# Patient Record
Sex: Female | Born: 1955 | Race: White | Hispanic: No | Marital: Married | Smoking: Former smoker
Health system: Southern US, Community
[De-identification: ages and names within clinical notes are randomized; demographics above are authoritative.]

## PROBLEM LIST (undated history)

## (undated) DIAGNOSIS — R251 Tremor, unspecified: Secondary | ICD-10-CM

## (undated) DIAGNOSIS — E559 Vitamin D deficiency, unspecified: Secondary | ICD-10-CM

## (undated) DIAGNOSIS — F419 Anxiety disorder, unspecified: Secondary | ICD-10-CM

## (undated) DIAGNOSIS — K2970 Gastritis, unspecified, without bleeding: Secondary | ICD-10-CM

## (undated) DIAGNOSIS — E042 Nontoxic multinodular goiter: Secondary | ICD-10-CM

## (undated) DIAGNOSIS — M1990 Unspecified osteoarthritis, unspecified site: Secondary | ICD-10-CM

## (undated) HISTORY — PX: MOHS SURGERY: SUR867

## (undated) HISTORY — PX: RETINAL DETACHMENT SURGERY: SHX105

## (undated) HISTORY — PX: TONSILLECTOMY: SUR1361

---

## 1998-02-06 ENCOUNTER — Other Ambulatory Visit: Admission: RE | Admit: 1998-02-06 | Discharge: 1998-02-06 | Payer: Self-pay | Admitting: Obstetrics and Gynecology

## 1999-08-25 ENCOUNTER — Other Ambulatory Visit: Admission: RE | Admit: 1999-08-25 | Discharge: 1999-08-25 | Payer: Self-pay | Admitting: Obstetrics and Gynecology

## 2001-05-11 ENCOUNTER — Other Ambulatory Visit: Admission: RE | Admit: 2001-05-11 | Discharge: 2001-05-11 | Payer: Self-pay | Admitting: Obstetrics and Gynecology

## 2002-07-14 ENCOUNTER — Other Ambulatory Visit: Admission: RE | Admit: 2002-07-14 | Discharge: 2002-07-14 | Payer: Self-pay | Admitting: Obstetrics and Gynecology

## 2003-09-03 ENCOUNTER — Other Ambulatory Visit: Admission: RE | Admit: 2003-09-03 | Discharge: 2003-09-03 | Payer: Self-pay | Admitting: Obstetrics and Gynecology

## 2003-09-05 ENCOUNTER — Encounter: Admission: RE | Admit: 2003-09-05 | Discharge: 2003-09-05 | Payer: Self-pay | Admitting: Obstetrics and Gynecology

## 2004-12-18 ENCOUNTER — Other Ambulatory Visit: Admission: RE | Admit: 2004-12-18 | Discharge: 2004-12-18 | Payer: Self-pay | Admitting: Obstetrics and Gynecology

## 2008-02-08 ENCOUNTER — Ambulatory Visit: Payer: Self-pay | Admitting: Obstetrics and Gynecology

## 2008-03-05 ENCOUNTER — Emergency Department: Payer: Self-pay | Admitting: Emergency Medicine

## 2008-07-17 HISTORY — PX: COLONOSCOPY: SHX174

## 2009-05-07 ENCOUNTER — Ambulatory Visit: Payer: Self-pay | Admitting: Obstetrics and Gynecology

## 2010-05-13 ENCOUNTER — Ambulatory Visit: Payer: Self-pay | Admitting: Obstetrics and Gynecology

## 2011-08-05 ENCOUNTER — Ambulatory Visit: Payer: Self-pay | Admitting: Obstetrics and Gynecology

## 2012-08-04 ENCOUNTER — Ambulatory Visit: Payer: Self-pay | Admitting: Obstetrics and Gynecology

## 2012-08-09 ENCOUNTER — Ambulatory Visit: Payer: Self-pay | Admitting: Obstetrics and Gynecology

## 2013-09-12 ENCOUNTER — Ambulatory Visit: Payer: Self-pay | Admitting: Obstetrics and Gynecology

## 2014-10-03 ENCOUNTER — Ambulatory Visit: Payer: Self-pay | Admitting: Obstetrics and Gynecology

## 2014-10-08 ENCOUNTER — Ambulatory Visit: Payer: Self-pay | Admitting: Obstetrics and Gynecology

## 2018-11-11 ENCOUNTER — Encounter: Admission: RE | Payer: Self-pay | Source: Home / Self Care

## 2018-11-11 ENCOUNTER — Ambulatory Visit
Admission: RE | Admit: 2018-11-11 | Payer: Managed Care, Other (non HMO) | Source: Home / Self Care | Admitting: Unknown Physician Specialty

## 2018-11-11 SURGERY — COLONOSCOPY WITH PROPOFOL
Anesthesia: General

## 2019-09-06 ENCOUNTER — Other Ambulatory Visit: Payer: Self-pay | Admitting: Obstetrics and Gynecology

## 2019-09-06 DIAGNOSIS — Z1231 Encounter for screening mammogram for malignant neoplasm of breast: Secondary | ICD-10-CM

## 2019-09-28 ENCOUNTER — Ambulatory Visit
Admission: RE | Admit: 2019-09-28 | Discharge: 2019-09-28 | Disposition: A | Discharge status: Home / Self Care | Payer: Managed Care, Other (non HMO) | Source: Ambulatory Visit | Attending: Obstetrics and Gynecology | Admitting: Obstetrics and Gynecology

## 2019-09-28 DIAGNOSIS — Z1231 Encounter for screening mammogram for malignant neoplasm of breast: Secondary | ICD-10-CM | POA: Diagnosis not present

## 2020-05-06 ENCOUNTER — Other Ambulatory Visit: Payer: Self-pay | Admitting: Nurse Practitioner

## 2020-05-06 ENCOUNTER — Telehealth: Payer: Self-pay | Admitting: Physician Assistant

## 2020-05-06 ENCOUNTER — Telehealth: Payer: Self-pay | Admitting: Nurse Practitioner

## 2020-05-06 DIAGNOSIS — Z20822 Contact with and (suspected) exposure to covid-19: Secondary | ICD-10-CM

## 2020-05-06 DIAGNOSIS — Z2883 Immunization not carried out due to unavailability of vaccine: Secondary | ICD-10-CM

## 2020-05-06 NOTE — Telephone Encounter (Signed)
Called to discuss with patient about Covid symptoms and the use of casirivimab/imdevimab, a monoclonal antibody infusion for those with mild to moderate Covid symptoms and at a high risk of hospitalization.  Pt is qualified for this infusion at the [facility] due to; Specific high risk criteria : Other high risk medical condition per CDC:  previous smoker   Message left to call back our hotline [phone].  Cline Crock PA-C  [facility]

## 2020-05-06 NOTE — Telephone Encounter (Signed)
Called to Discuss with patient about Covid symptoms and the use of the SQ monoclonal antibody injection for those who have been exposed to Covid and at a high risk of hospitalization.     Pt appears to qualify for this injection due to co-morbid conditions and/or a member of an at-risk group in accordance with the FDA Emergency Use Authorization.    Unable to reach pt   

## 2020-05-06 NOTE — Progress Notes (Signed)
Jessica Preston is a 64 y.o. female that meets the FDA criteria for Emergency Use Authorization of casirivimab/imdevimab (REGEN-COV) for post-exposure prophylaxis of COVID-19 in individuals who are at high risk for progression to severe COVID-19, including hospitalization or death, and are: . Not fully vaccinated or who are not expected to mount an adequate immune response to complete SARS-CoV-2 vaccination (for example, individuals with immunocompromising conditions including those taking immunosuppressive medications) and - Have been exposed to an individual infected with SARS-CoV-2 consistent with close contact criteria per CDC or - Who are at high risk of exposure to an individual infected with SARS-CoV-2 because of occurrence of COVID-19 infection in other individuals in the same institutional setting (for example, nursing homes or prisons) . For individuals in whom repeat dosing is determined to be appropriate for ongoing exposure to SARS-CoV-2 for longer than 4 weeks and who are not expected to mount an adequate immune response to complete SARS-CoV-2 vaccination, the initial dose is 600 mg of casirivimab and 600 mg of imdevimab (1200mg total) by subcutaneous injection or intravenous infusion followed by subsequent repeat dosing of 300 mg of casirivimab and 300 mg of imdevimab (600mg total) by subcutaneous injection or intravenous infusion once every 4 weeks for the duration of ongoing exposure.   Patient has met the above post-exposure definition and has the following high risk criteria: Other high risk medical condition per CDC:  not vaccinated  I have spoken and communicated the following to the patient or parent/caregiver regarding COVID monoclonal antibody treatment:   FDA has authorized REGEN-COV for emergency use of post-exposure prophylaxis of COVID-19   The significant known and potential risks and benefits of COVID monoclonal antibody, and the extent to which such potential risks and  benefits are unknown.   Information on available alternative treatments and the risks and benefits of those alternatives, including clinical trials.   Patients treated with COVID monoclonal antibody should continue to self-isolate and use infection control measures (e.g., wear mask, isolate, social distance, avoid sharing personal items, clean and disinfect "high touch" surfaces, and frequent handwashing) according to CDC guidelines.    The patient or parent/caregiver has the option to accept or refuse COVID monoclonal antibody treatment.  After reviewing this information with the patient, The patient has agreed to proceed with receiving casirivimab\imdevimab 1200mg injection and will be provided a copy of the patient fact sheet prior to receiving the injection.  [name], NP 05/06/2020 4:07 PM  

## 2020-05-08 ENCOUNTER — Ambulatory Visit: Payer: Managed Care, Other (non HMO)

## 2020-05-13 ENCOUNTER — Ambulatory Visit (INDEPENDENT_AMBULATORY_CARE_PROVIDER_SITE_OTHER): Payer: Managed Care, Other (non HMO)

## 2020-05-13 VITALS — BP 128/70 | HR 64 | Temp 97.5°F

## 2020-05-13 DIAGNOSIS — Z20822 Contact with and (suspected) exposure to covid-19: Secondary | ICD-10-CM

## 2020-05-13 DIAGNOSIS — R030 Elevated blood-pressure reading, without diagnosis of hypertension: Secondary | ICD-10-CM | POA: Diagnosis not present

## 2020-05-13 MED ORDER — CLONIDINE HCL 0.1 MG PO TABS
0.1000 mg | ORAL_TABLET | Freq: Once | ORAL | Status: AC
  Administered 2020-05-13: 0.1 mg via ORAL

## 2020-05-13 MED ORDER — CASIRIVIMAB-IMDEVIMAB 600-600 MG/10ML IJ SOLN
300.0000 mg | INTRAMUSCULAR | Status: AC
  Administered 2020-05-13 (×4): 300 mg via SUBCUTANEOUS

## 2020-05-13 NOTE — Progress Notes (Signed)
  Diagnosis: COVID-19 post-exposure prophylaxis  Provider: Angus Seller  Procedure: casirivimab\imdevimab subcutaneous injection - Provided patient with casirivimab\imdevimab fact sheet for patients, parents and caregivers prior to injection.  Complications:0915 Patient arrived anxious, expressing concerns with questions about the monoclonal injections.BP elevated after several checks. Angus Seller NP consulted; questions answered and received order for Clonidine.  0946 BP improving. States she feels better and is less anxious.  Discharge: Discharged home   Ruta Hinds, [place] 05/13/2020

## 2020-05-13 NOTE — Patient Instructions (Addendum)

## 2020-07-08 ENCOUNTER — Other Ambulatory Visit: Payer: Self-pay | Admitting: Internal Medicine

## 2020-07-08 DIAGNOSIS — Z1231 Encounter for screening mammogram for malignant neoplasm of breast: Secondary | ICD-10-CM

## 2020-12-09 HISTORY — PX: COLONOSCOPY WITH ESOPHAGOGASTRODUODENOSCOPY (EGD): SHX5779

## 2020-12-27 ENCOUNTER — Other Ambulatory Visit: Payer: Self-pay

## 2020-12-27 ENCOUNTER — Ambulatory Visit
Admission: RE | Admit: 2020-12-27 | Discharge: 2020-12-27 | Disposition: A | Discharge status: Home / Self Care | Payer: BC Managed Care – PPO | Source: Ambulatory Visit | Attending: Internal Medicine | Admitting: Internal Medicine

## 2020-12-27 DIAGNOSIS — Z1231 Encounter for screening mammogram for malignant neoplasm of breast: Secondary | ICD-10-CM | POA: Diagnosis not present

## 2021-06-25 ENCOUNTER — Other Ambulatory Visit: Payer: Self-pay | Admitting: Neurology

## 2021-06-25 DIAGNOSIS — R4189 Other symptoms and signs involving cognitive functions and awareness: Secondary | ICD-10-CM

## 2021-07-04 ENCOUNTER — Ambulatory Visit: Payer: BC Managed Care – PPO

## 2021-10-21 ENCOUNTER — Other Ambulatory Visit: Payer: Self-pay

## 2021-10-21 ENCOUNTER — Encounter
Admission: RE | Admit: 2021-10-21 | Discharge: 2021-10-21 | Disposition: A | Discharge status: Home / Self Care | Payer: BC Managed Care – PPO | Source: Ambulatory Visit | Attending: Surgery | Admitting: Surgery

## 2021-10-21 ENCOUNTER — Ambulatory Visit: Payer: Self-pay | Admitting: Surgery

## 2021-10-21 HISTORY — DX: Tremor, unspecified: R25.1

## 2021-10-21 HISTORY — DX: Unspecified osteoarthritis, unspecified site: M19.90

## 2021-10-21 HISTORY — DX: Gastritis, unspecified, without bleeding: K29.70

## 2021-10-21 HISTORY — DX: Vitamin D deficiency, unspecified: E55.9

## 2021-10-21 HISTORY — DX: Anxiety disorder, unspecified: F41.9

## 2021-10-21 HISTORY — DX: Nontoxic multinodular goiter: E04.2

## 2021-10-21 NOTE — H&P (Signed)
Subjective:  CC: Seborrheic keratoses [L82.1]  HPI:  Jessica Preston is a 66 y.o. female who presents for evaluation of above. First noted several years ago.  Symptoms include: itching.  Exacerbated by nothing.  Alleviated by nothing.  Associated with nothing.     Past Medical History:  has a past medical history of Anxiety, Arthritis, Chest pain, Chronic superficial gastritis without bleeding (06/21/2015), COVID-19, History of gastritis (06/21/2015), Multiple thyroid nodules, and Tremor.  Past Surgical History:  has a past surgical history that includes Tonsillectomy; Mohs surgery; Colonoscopy (07/17/2008); Upper gastrointestinal endoscopy; Colonoscopy (12/09/2020); and vitreous retinal surgery (Left).  Family History: family history includes Cerebral aneurysm in her mother.  Social History:  reports that she has quit smoking. Her smoking use included cigarettes. She has never used smokeless tobacco. She reports current alcohol use. She reports that she does not use drugs.  Current Medications: has a current medication list which includes the following prescription(s): alprazolam, biotin, cholecalciferol, docosahexaenoic acid/epa, memantine, propranolol, azelastine, benzonatate, hydrocodone-chlorpheniramine, olopatadine, and sertraline.  Allergies:  No Known Allergies  ROS:  A 15 point review of systems was performed and pertinent positives and negatives noted in HPI   Objective:    BP (!) 140/74    Pulse 60    Ht 165.1 cm (5\' 5" )    Wt 63.5 kg (140 lb)    BMI 23.30 kg/m   Constitutional :  No distress, cooperative, alert Lymphatics/Throat:  Supple with no lymphadenopathy Respiratory:  Clear to auscultation bilaterally Cardiovascular:  Regular rate and rhythm Gastrointestinal: Soft, non-tender, non-distended, no organomegaly. Musculoskeletal: Steady gait and movement Skin: Cool and moist, SK noted on scalp, right temporal/parietal area.  Two additional smaller ones noted on left  temporal hair line Psychiatric: Normal affect, non-agitated, not confused       LABS:  n/a   RADS: n/a  Assessment:     Seborrheic keratoses [L82.1]  Plan:    1. Seborrheic keratoses [L82.1] Discussed surgical curette/removal.  Alternatives include continued observation.  Benefits include possible symptom relief, pathologic evaluation, improved cosmesis. Discussed the risk of surgery including recurrence, chronic pain, post-op infxn, poor cosmesis, poor/delayed wound healing, and possible re-operation to address said risks. The risks of general anesthetic, if used, includes MI, CVA, sudden death or even reaction to anesthetic medications also discussed.  Typical post-op recovery time of 3-5 days with possible activity restrictions were also discussed.  The patient verbalized understanding and all questions were answered to the patient's satisfaction.  2. Patient has elected to proceed with surgical treatment. Procedure will be scheduled. Pt will discuss with dermatologist prior to procedure if smaller lesions on left temple can be removed by them.  Will still plan to proceed with removing all three just in case  labs/images/medications/previous chart entries reviewed personally and relevant changes/updates noted above.

## 2021-10-21 NOTE — Patient Instructions (Addendum)
Your procedure is scheduled on: Friday, March 3 Report to the Registration Desk on the 1st floor of the [facility] To find out your arrival time, please call [phone] between 1PM - 3PM on: Thursday, March 2  REMEMBER: Instructions that are not followed completely may result in serious medical risk, up to and including death; or upon the discretion of your surgeon and anesthesiologist your surgery may need to be rescheduled.  Do not eat food after midnight the night before surgery.  No gum chewing, lozengers or hard candies.  You may however, drink CLEAR liquids up to 2 hours before you are scheduled to arrive for your surgery. Do not drink anything within 2 hours of your scheduled arrival time.  Clear liquids include: - water  - apple juice without pulp - gatorade (not RED colors) - black coffee or tea (Do NOT add milk or creamers to the coffee or tea) Do NOT drink anything that is not on this list.  TAKE THESE MEDICATIONS THE MORNING OF SURGERY WITH A SIP OF WATER:  Sertraline (zoloft) Memantine (Namenda) Propranolol  One week prior to surgery: Stop Anti-inflammatories (NSAIDS) such as Advil, Aleve, Ibuprofen, Motrin, Naproxen, Naprosyn and Aspirin based products such as Excedrin, Goodys Powder, BC Powder. Stop ANY OVER THE COUNTER supplements until after surgery. You may however, continue to take Tylenol if needed for pain up until the day of surgery.  No Alcohol for 24 hours before or after surgery.  No Smoking including e-cigarettes for 24 hours prior to surgery.  No chewable tobacco products for at least 6 hours prior to surgery.  No nicotine patches on the day of surgery.  Do not use any "recreational" drugs for at least a week prior to your surgery.  Please be advised that the combination of cocaine and anesthesia may have negative outcomes, up to and including death. If you test positive for cocaine, your surgery will be cancelled.  On the morning of surgery  brush your teeth with toothpaste and water, you may rinse your mouth with mouthwash if you wish. Do not swallow any toothpaste or mouthwash.  Do not wear jewelry, make-up, hairpins, clips or nail polish.  Do not wear lotions, powders, or perfumes.   Do not shave body from the neck down 48 hours prior to surgery just in case you cut yourself which could leave a site for infection.   Contact lenses, hearing aids and dentures may not be worn into surgery.  Do not bring valuables to the hospital. [facility] is not responsible for any missing/lost belongings or valuables.   Notify your doctor if there is any change in your medical condition (cold, fever, infection).  Wear comfortable clothing (specific to your surgery type) to the hospital.  After surgery, you can help prevent lung complications by doing breathing exercises.  Take deep breaths and cough every 1-2 hours. Your doctor may order a device called an Incentive Spirometer to help you take deep breaths.  If you are being discharged the day of surgery, you will not be allowed to drive home. You will need a responsible adult (18 years or older) to drive you home and stay with you that night.   If you are taking public transportation, you will need to have a responsible adult (18 years or older) with you. Please confirm with your physician that it is acceptable to use public transportation.   Please call the Pre-admissions Testing Dept. at [phone] if you have any questions about these  instructions.  Surgery Visitation Policy:  Patients undergoing a surgery or procedure may have one family member or support person with them as long as that person is not COVID-19 positive or experiencing its symptoms.  That person may remain in the waiting area during the procedure and may rotate out with other people.

## 2021-10-24 MED ORDER — CHLORHEXIDINE GLUCONATE 0.12 % MT SOLN: 15.0000 mL | Freq: Once | OROMUCOSAL | Status: AC

## 2021-10-24 MED ORDER — LACTATED RINGERS IV SOLN: INTRAVENOUS | Status: DC

## 2021-10-24 MED ORDER — CHLORHEXIDINE GLUCONATE CLOTH 2 % EX PADS: 6.0000 | MEDICATED_PAD | Freq: Once | CUTANEOUS | Status: DC

## 2021-10-24 MED ORDER — ORAL CARE MOUTH RINSE: 15.0000 mL | Freq: Once | OROMUCOSAL | Status: AC

## 2021-10-24 MED ORDER — FAMOTIDINE 20 MG PO TABS: 20.0000 mg | ORAL_TABLET | Freq: Once | ORAL | Status: AC

## 2021-10-24 MED ORDER — CEFAZOLIN SODIUM-DEXTROSE 2-4 GM/100ML-% IV SOLN: 2.0000 g | INTRAVENOUS | Status: AC

## 2021-11-21 ENCOUNTER — Ambulatory Visit: Payer: BC Managed Care – PPO | Admitting: Urgent Care

## 2021-11-21 ENCOUNTER — Encounter
Admission: RE | Disposition: A | Discharge status: Home / Self Care | Payer: Self-pay | Source: Home / Self Care | Attending: Surgery

## 2021-11-21 ENCOUNTER — Other Ambulatory Visit: Payer: Self-pay

## 2021-11-21 ENCOUNTER — Encounter: Payer: Self-pay | Admitting: Surgery

## 2021-11-21 ENCOUNTER — Ambulatory Visit
Admission: RE | Admit: 2021-11-21 | Discharge: 2021-11-21 | Disposition: A | Discharge status: Home / Self Care | Payer: BC Managed Care – PPO | Attending: Surgery | Admitting: Surgery

## 2021-11-21 DIAGNOSIS — K219 Gastro-esophageal reflux disease without esophagitis: Secondary | ICD-10-CM | POA: Insufficient documentation

## 2021-11-21 DIAGNOSIS — Z87891 Personal history of nicotine dependence: Secondary | ICD-10-CM | POA: Diagnosis not present

## 2021-11-21 DIAGNOSIS — L821 Other seborrheic keratosis: Secondary | ICD-10-CM | POA: Diagnosis present

## 2021-11-21 DIAGNOSIS — F419 Anxiety disorder, unspecified: Secondary | ICD-10-CM | POA: Insufficient documentation

## 2021-11-21 DIAGNOSIS — R251 Tremor, unspecified: Secondary | ICD-10-CM | POA: Diagnosis not present

## 2021-11-21 HISTORY — PX: EXCISION OF KELOID: SHX6267

## 2021-11-21 SURGERY — EXCISION, KELOID
Anesthesia: General | Site: Scalp

## 2021-11-21 MED ORDER — OXYCODONE-ACETAMINOPHEN 5-325 MG PO TABS: 1.0000 | ORAL_TABLET | Freq: Three times a day (TID) | ORAL | 0 refills | Status: AC | PRN

## 2021-11-21 MED ORDER — LACTATED RINGERS IV SOLN: INTRAVENOUS | Status: DC

## 2021-11-21 MED ORDER — DOCUSATE SODIUM 100 MG PO CAPS: 100.0000 mg | ORAL_CAPSULE | Freq: Two times a day (BID) | ORAL | 0 refills | Status: AC | PRN

## 2021-11-21 MED ORDER — CHLORHEXIDINE GLUCONATE 0.12 % MT SOLN
OROMUCOSAL | Status: AC
  Administered 2021-11-21: 15 mL via OROMUCOSAL
  Filled 2021-11-21: qty 15

## 2021-11-21 MED ORDER — BUPIVACAINE-EPINEPHRINE 0.5% -1:200000 IJ SOLN
INTRAMUSCULAR | Status: DC | PRN
  Administered 2021-11-21: 10 mL

## 2021-11-21 MED ORDER — ACETAMINOPHEN 325 MG PO TABS: 650.0000 mg | ORAL_TABLET | Freq: Three times a day (TID) | ORAL | 0 refills | Status: AC | PRN

## 2021-11-21 MED ORDER — DEXAMETHASONE SODIUM PHOSPHATE 10 MG/ML IJ SOLN
INTRAMUSCULAR | Status: DC | PRN
  Administered 2021-11-21: 10 mg via INTRAVENOUS

## 2021-11-21 MED ORDER — MIDAZOLAM HCL 2 MG/2ML IJ SOLN
INTRAMUSCULAR | Status: AC
  Filled 2021-11-21: qty 2

## 2021-11-21 MED ORDER — FENTANYL CITRATE (PF) 100 MCG/2ML IJ SOLN: 25.0000 ug | INTRAMUSCULAR | Status: DC | PRN

## 2021-11-21 MED ORDER — PROPOFOL 10 MG/ML IV BOLUS
INTRAVENOUS | Status: AC
  Filled 2021-11-21: qty 20

## 2021-11-21 MED ORDER — FAMOTIDINE 20 MG PO TABS
ORAL_TABLET | ORAL | Status: AC
  Administered 2021-11-21: 20 mg via ORAL
  Filled 2021-11-21: qty 1

## 2021-11-21 MED ORDER — OXYCODONE HCL 5 MG PO TABS: 5.0000 mg | ORAL_TABLET | Freq: Once | ORAL | Status: DC | PRN

## 2021-11-21 MED ORDER — MIDAZOLAM HCL 2 MG/2ML IJ SOLN
INTRAMUSCULAR | Status: DC | PRN
  Administered 2021-11-21: 2 mg via INTRAVENOUS

## 2021-11-21 MED ORDER — GLYCOPYRROLATE 0.2 MG/ML IJ SOLN
INTRAMUSCULAR | Status: AC
  Filled 2021-11-21: qty 1

## 2021-11-21 MED ORDER — BACITRACIN-NEOMYCIN-POLYMYXIN 400-5-5000 EX OINT
TOPICAL_OINTMENT | CUTANEOUS | Status: AC
  Filled 2021-11-21: qty 1

## 2021-11-21 MED ORDER — LIDOCAINE HCL (PF) 1 % IJ SOLN
INTRAMUSCULAR | Status: AC
  Filled 2021-11-21: qty 30

## 2021-11-21 MED ORDER — OXYCODONE HCL 5 MG/5ML PO SOLN: 5.0000 mg | Freq: Once | ORAL | Status: DC | PRN

## 2021-11-21 MED ORDER — IBUPROFEN 800 MG PO TABS: 800.0000 mg | ORAL_TABLET | Freq: Three times a day (TID) | ORAL | 0 refills | Status: AC | PRN

## 2021-11-21 MED ORDER — ONDANSETRON HCL 4 MG/2ML IJ SOLN: 4.0000 mg | Freq: Once | INTRAMUSCULAR | Status: DC | PRN

## 2021-11-21 MED ORDER — FENTANYL CITRATE (PF) 100 MCG/2ML IJ SOLN
INTRAMUSCULAR | Status: DC | PRN
Start: 2021-11-21 — End: 2021-11-21
  Administered 2021-11-21: 25 ug via INTRAVENOUS

## 2021-11-21 MED ORDER — FENTANYL CITRATE (PF) 100 MCG/2ML IJ SOLN
INTRAMUSCULAR | Status: AC
  Filled 2021-11-21: qty 2

## 2021-11-21 MED ORDER — GLYCOPYRROLATE 0.2 MG/ML IJ SOLN
INTRAMUSCULAR | Status: DC | PRN
  Administered 2021-11-21: .2 mg via INTRAVENOUS

## 2021-11-21 MED ORDER — ACETAMINOPHEN 10 MG/ML IV SOLN: 1000.0000 mg | Freq: Once | INTRAVENOUS | Status: DC | PRN

## 2021-11-21 MED ORDER — BUPIVACAINE-EPINEPHRINE (PF) 0.5% -1:200000 IJ SOLN
INTRAMUSCULAR | Status: AC
  Filled 2021-11-21: qty 30

## 2021-11-21 MED ORDER — BACITRACIN 500 UNIT/GM EX OINT
TOPICAL_OINTMENT | CUTANEOUS | Status: DC | PRN
  Administered 2021-11-21: 1 via TOPICAL

## 2021-11-21 MED ORDER — EPHEDRINE SULFATE (PRESSORS) 50 MG/ML IJ SOLN
INTRAMUSCULAR | Status: DC | PRN
  Administered 2021-11-21 (×2): 10 mg via INTRAVENOUS
  Administered 2021-11-21: 5 mg via INTRAVENOUS

## 2021-11-21 MED ORDER — LIDOCAINE HCL (CARDIAC) PF 100 MG/5ML IV SOSY
PREFILLED_SYRINGE | INTRAVENOUS | Status: DC | PRN
  Administered 2021-11-21: 100 mg via INTRAVENOUS

## 2021-11-21 MED ORDER — PROPOFOL 10 MG/ML IV BOLUS
INTRAVENOUS | Status: DC | PRN
Start: 2021-11-21 — End: 2021-11-21
  Administered 2021-11-21: 120 mg via INTRAVENOUS

## 2021-11-21 MED ORDER — ONDANSETRON HCL 4 MG/2ML IJ SOLN
INTRAMUSCULAR | Status: DC | PRN
  Administered 2021-11-21: 4 mg via INTRAVENOUS

## 2021-11-21 MED ORDER — CEFAZOLIN SODIUM-DEXTROSE 2-3 GM-%(50ML) IV SOLR
INTRAVENOUS | Status: DC | PRN
  Administered 2021-11-21: 2 g via INTRAVENOUS

## 2021-11-21 SURGICAL SUPPLY — 34 items
ADH SKN CLS APL DERMABOND .7 (GAUZE/BANDAGES/DRESSINGS) ×1
BLADE SURG 15 STRL LF DISP TIS (BLADE) ×2 IMPLANT
BLADE SURG 15 STRL SS (BLADE) ×2
BNDG GAUZE ELAST 4 BULKY (GAUZE/BANDAGES/DRESSINGS) ×1 IMPLANT
DERMABOND ADVANCED (GAUZE/BANDAGES/DRESSINGS) ×1
DERMABOND ADVANCED .7 DNX12 (GAUZE/BANDAGES/DRESSINGS) ×2 IMPLANT
DRAPE LAPAROTOMY 77X122 PED (DRAPES) ×3 IMPLANT
ELECT CAUTERY BLADE 6.4 (BLADE) ×3 IMPLANT
ELECT REM PT RETURN 9FT ADLT (ELECTROSURGICAL) ×2
ELECTRODE REM PT RTRN 9FT ADLT (ELECTROSURGICAL) ×2 IMPLANT
GAUZE 4X4 16PLY ~~LOC~~+RFID DBL (SPONGE) ×3 IMPLANT
GLOVE SURG SYN 6.5 ES PF (GLOVE) ×6 IMPLANT
GLOVE SURG SYN 6.5 PF PI (GLOVE) ×2 IMPLANT
GLOVE SURG UNDER POLY LF SZ7 (GLOVE) ×5 IMPLANT
GOWN STRL REUS W/ TWL LRG LVL3 (GOWN DISPOSABLE) ×4 IMPLANT
GOWN STRL REUS W/TWL LRG LVL3 (GOWN DISPOSABLE) ×6
HEMOSTAT SURGICEL 2X3 (HEMOSTASIS) ×1 IMPLANT
KIT TURNOVER KIT A (KITS) ×3 IMPLANT
LABEL OR SOLS (LABEL) ×3 IMPLANT
MANIFOLD NEPTUNE II (INSTRUMENTS) ×3 IMPLANT
NEEDLE HYPO 22GX1.5 SAFETY (NEEDLE) ×3 IMPLANT
NS IRRIG 500ML POUR BTL (IV SOLUTION) ×2 IMPLANT
PACK BASIN MINOR ARMC (MISCELLANEOUS) ×3 IMPLANT
SOL PREP PVP 2OZ (MISCELLANEOUS) ×2
SOLUTION PREP PVP 2OZ (MISCELLANEOUS) ×2 IMPLANT
SUT MNCRL 4-0 (SUTURE) ×2
SUT MNCRL 4-0 27XMFL (SUTURE) ×1
SUT VIC AB 3-0 SH 27 (SUTURE) ×2
SUT VIC AB 3-0 SH 27X BRD (SUTURE) ×2 IMPLANT
SUTURE MNCRL 4-0 27XMF (SUTURE) ×2 IMPLANT
SYR 30ML LL (SYRINGE) ×2 IMPLANT
SYR BULB IRRIG 60ML STRL (SYRINGE) ×3 IMPLANT
TOWEL OR 17X26 4PK STRL BLUE (TOWEL DISPOSABLE) ×3 IMPLANT
WATER STERILE IRR 500ML POUR (IV SOLUTION) ×2 IMPLANT

## 2021-11-21 NOTE — Anesthesia Preprocedure Evaluation (Addendum)
Anesthesia Evaluation  ?Patient identified by MRN, date of birth, ID band ?Patient awake ? ? ? ?Reviewed: ?Allergy & Precautions, NPO status , Patient's Chart, lab work & pertinent test results ? ?History of Anesthesia Complications ?Negative for: history of anesthetic complications ? ?Airway ?Mallampati: I ? ? ?Neck ROM: Full ? ? ? Dental ? ?(+)  ?  ?Pulmonary ?former smoker (quit age 66),  ?  ?Pulmonary exam normal ?breath sounds clear to auscultation ? ? ? ? ? ? Cardiovascular ?Exercise Tolerance: Good ?negative cardio ROS ?Normal cardiovascular exam ?Rhythm:Regular Rate:Normal ? ?Echo 08/30/20:  ?NORMAL LEFT VENTRICULAR SYSTOLIC FUNCTION  ?NORMAL RIGHT VENTRICULAR SYSTOLIC FUNCTION  ?MILD VALVULAR REGURGITATION  ?NO VALVULAR STENOSIS  ?TRIVIAL MR  ?MILD TR  ?EF 50-55% ?  ?Neuro/Psych ?Anxiety Tremor  ?  ? GI/Hepatic ?GERD  ,  ?Endo/Other  ?negative endocrine ROS ? Renal/GU ?negative Renal ROS  ? ?  ?Musculoskeletal ? ? Abdominal ?  ?Peds ? Hematology ?negative hematology ROS ?(+)   ?Anesthesia Other Findings ? ? Reproductive/Obstetrics ? ?  ? ? ? ? ? ? ? ? ? ? ? ? ? ?  ?  ? ? ? ? ? ? ? ?Anesthesia Physical ?Anesthesia Plan ? ?ASA: 2 ? ?Anesthesia Plan: General  ? ?Post-op Pain Management:   ? ?Induction: Intravenous ? ?PONV Risk Score and Plan: 3 and Ondansetron, Treatment may vary due to age or medical condition, Propofol infusion and TIVA ? ?Airway Management Planned: Natural Airway ? ?Additional Equipment:  ? ?Intra-op Plan:  ? ?Post-operative Plan:  ? ?Informed Consent: I have reviewed the patients History and Physical, chart, labs and discussed the procedure including the risks, benefits and alternatives for the proposed anesthesia with the patient or authorized representative who has indicated his/her understanding and acceptance.  ? ? ? ?Dental advisory given ? ?Plan Discussed with: CRNA ? ?Anesthesia Plan Comments: (LMA/GETA backup discussed.  Patient consented for risks  of anesthesia including but not limited to:  ?- adverse reactions to medications ?- damage to eyes, teeth, lips or other oral mucosa ?- nerve damage due to positioning  ?- sore throat or hoarseness ?- damage to heart, brain, nerves, lungs, other parts of body or loss of life ? ?Informed patient about role of CRNA in peri- and intra-operative care.  Patient voiced understanding.)  ? ? ? ? ? ?Anesthesia Quick Evaluation ? ?

## 2021-11-21 NOTE — Anesthesia Procedure Notes (Signed)
Procedure Name: LMA Insertion ?Date/Time: 11/21/2021 11:20 AM ?Performed by: Joanette Gula, Maurisio Ruddy, CRNA ?Pre-anesthesia Checklist: Patient identified, Emergency Drugs available, Suction available and Patient being monitored ?Patient Re-evaluated:Patient Re-evaluated prior to induction ?Oxygen Delivery Method: Circle system utilized ?Preoxygenation: Pre-oxygenation with 100% oxygen ?Induction Type: IV induction ?Ventilation: Mask ventilation without difficulty ?LMA: LMA inserted ?LMA Size: 3.0 ?Number of attempts: 1 ?Placement Confirmation: positive ETCO2 and breath sounds checked- equal and bilateral ?Tube secured with: Tape ?Dental Injury: Teeth and Oropharynx as per pre-operative assessment  ? ? ? ? ?

## 2021-11-21 NOTE — Transfer of Care (Signed)
Immediate Anesthesia Transfer of Care Note ? ?Patient: Jessica Preston ? ?Procedure(s) Performed: EXCISION OF KELOID (Scalp) ? ?Patient Location: PACU ? ?Anesthesia Type:General ? ?Level of Consciousness: sedated ? ?Airway & Oxygen Therapy: Patient Spontanous Breathing and Patient connected to face mask oxygen ? ?Post-op Assessment: Report given to RN and Post -op Vital signs reviewed and stable ? ?Post vital signs: Reviewed ? ?Last Vitals:  ?Vitals Value Taken Time  ?BP    ?Temp    ?Pulse 58 11/21/21 1212  ?Resp 15 11/21/21 1212  ?SpO2 100 % 11/21/21 1212  ?Vitals shown include unvalidated device data. ? ?Last Pain:  ?Vitals:  ? 11/21/21 1025  ?TempSrc: Temporal  ?PainSc: 0-No pain  ?   ? ?Patients Stated Pain Goal: 0 (11/21/21 1025) ? ?Complications: No notable events documented. ?

## 2021-11-21 NOTE — Interval H&P Note (Signed)
History and Physical Interval Note: ? ?11/21/2021 ?11:04 AM ? ?Jessica Preston  has presented today for surgery, with the diagnosis of Seborrheic keratoses L82.1.  The various methods of treatment have been discussed with the patient and family. After consideration of risks, benefits and other options for treatment, the patient has consented to  Procedure(s): ?EXCISION OF KELOID (N/A) as a surgical intervention.  She did not f/u with derm so will be removing all three lesions. The patient's history has been reviewed, patient examined, no change in status, stable for surgery.  I have reviewed the patient's chart and labs.  Questions were answered to the patient's satisfaction.   ? ? ?[name] ? ? ?

## 2021-11-21 NOTE — Anesthesia Postprocedure Evaluation (Signed)
Anesthesia Post Note ? ?Patient: Jessica Preston ? ?Procedure(s) Performed: EXCISION OF KELOID (Scalp) ? ?Patient location during evaluation: PACU ?Anesthesia Type: General ?Level of consciousness: awake and alert, oriented and patient cooperative ?Pain management: pain level controlled ?Vital Signs Assessment: post-procedure vital signs reviewed and stable ?Respiratory status: spontaneous breathing, nonlabored ventilation and respiratory function stable ?Cardiovascular status: blood pressure returned to baseline and stable ?Postop Assessment: adequate PO intake ?Anesthetic complications: no ? ? ?No notable events documented. ? ? ?Last Vitals:  ?Vitals:  ? 11/21/21 1254 11/21/21 1306  ?BP:  (!) 169/78  ?Pulse: 64 (!) 58  ?Resp:  16  ?Temp: (!) 36.1 ?C (!) 36.1 ?C  ?SpO2:  99%  ?  ?Last Pain:  ?Vitals:  ? 11/21/21 1306  ?TempSrc: Temporal  ?PainSc: 0-No pain  ? ? ?  ?  ?  ?  ?  ?  ? ?Reed Breech ? ? ? ? ?

## 2021-11-21 NOTE — Discharge Instructions (Addendum)
? ? ? ?AMBULATORY SURGERY  ?DISCHARGE INSTRUCTIONS ? ? ?Removal, Care After ?This sheet gives you information about how to care for yourself after your procedure. Your health care provider may also give you more specific instructions. If you have problems or questions, contact your health care provider. ?What can I expect after the procedure? ?After the procedure, it is common to have: ?Soreness. ?Bruising. ?Itching. ?Follow these instructions at home: ?site care ?Follow instructions from your health care provider about how to take care of your site. Make sure you: ?Wash your hands with soap and water before and after you change your bandage (dressing). If soap and water are not available, use hand sanitizer. ?KEEP DRESSING INTACT FOR 48HRS IF POSSIBLE.  THEN OK TO REMOVE AND SHOWER.  APPLY NEOSPORIN TO AREAS DAILY UNTIL HEALED. ?If the area bleeds or bruises, apply gentle pressure for 10 minutes. ? ?Check your site every day for signs of infection. Check for: ?Redness, swelling, or pain. ?Fluid or blood. ?Warmth. ?Pus or a bad smell. ? ?General instructions ?Rest and then return to your normal activities as told by your health care provider. ? tylenol and advil as needed for discomfort.  Please alternate between the two every four hours as needed for pain.   ? Use narcotics, if prescribed, only when tylenol and motrin is not enough to control pain. ? 325-650mg  every 8hrs to max of 3000mg /24hrs (including the 325mg  in every norco dose) for the tylenol.   ? Advil up to 800mg  per dose every 8hrs as needed for pain.   ?Keep all follow-up visits as told by your health care provider. This is important. ?Contact a health care provider if: ?You have redness, swelling, or pain around your site. ?You have fluid or blood coming from your site. ?Your site feels warm to the touch. ?You have pus or a bad smell coming from your site. ?You have a fever. ?Your sutures, skin glue, or adhesive strips loosen or come off sooner than  expected. ?Get help right away if: ?You have bleeding that does not stop with pressure or a dressing. ?Summary ?After the procedure, it is common to have some soreness, bruising, and itching at the site. ?Follow instructions from your health care provider about how to take care of your site. ?Check your site every day for signs of infection. ?Contact a health care provider if you have redness, swelling, or pain around your site, or your site feels warm to the touch. ?Keep all follow-up visits as told by your health care provider. This is important. ?This information is not intended to replace advice given to you by your health care provider. Make sure you discuss any questions you have with your health care provider. ?Document Released: 09/06/2015 Document Revised: 02/07/2018 Document Reviewed: 02/07/2018 ?Elsevier Interactive Patient Education ? 2019 [place]. ? ? ? ? ? ?The drugs that you were given will stay in your system until tomorrow so for the next 24 hours you should not: ? ?Drive an automobile ?Make any legal decisions ?Drink any alcoholic beverage ? ? ?You may resume regular meals tomorrow.  Today it is better to start with liquids and gradually work up to solid foods. ? ?You may eat anything you prefer, but it is better to start with liquids, then soup and crackers, and gradually work up to solid foods. ? ? ?Please notify your doctor immediately if you have any unusual bleeding, trouble breathing, redness and pain at the surgery site, drainage, fever, or pain not  relieved by medication. ? ? ? ?Additional Instructions: ? ? ? ? ? ? ? ?Please contact your physician with any problems or Same Day Surgery at [phone], Monday through Friday 6 am to 4 pm, or [place] at [facility] number at [phone].  ?

## 2021-11-21 NOTE — H&P (Signed)
Subjective:  ? ?CC: Seborrheic keratoses [L82.1] ? ?HPI: ?Jessica Preston is a 66 y.o. female who presents for evaluation of above. First noted several years ago. Symptoms include: itching. Exacerbated by nothing. Alleviated by nothing. Associated with nothing.  ? ?Past Medical History: has a past medical history of Anxiety, Arthritis, Chest pain, Chronic superficial gastritis without bleeding (06/21/2015), COVID-19, History of gastritis (06/21/2015), Multiple thyroid nodules, and Tremor. ? ?Past Surgical History: has a past surgical history that includes Tonsillectomy; Mohs surgery; Colonoscopy (07/17/2008); Upper gastrointestinal endoscopy; Colonoscopy (12/09/2020); and vitreous retinal surgery (Left). ? ?Family History: family history includes Cerebral aneurysm in her mother. ? ?Social History: reports that she has quit smoking. Her smoking use included cigarettes. She has never used smokeless tobacco. She reports current alcohol use. She reports that she does not use drugs. ? ?Current Medications: has a current medication list which includes the following prescription(s): alprazolam, biotin, cholecalciferol, docosahexaenoic acid/epa, memantine, propranolol, azelastine, benzonatate, hydrocodone-chlorpheniramine, olopatadine, and sertraline. ? ?Allergies:  ?No Known Allergies ? ?ROS:  ?A 15 point review of systems was performed and pertinent positives and negatives noted in HPI ? ?Objective:  ? ? ?BP (!) 140/74  Pulse 60  Ht 165.1 cm (5\' 5" )  Wt 63.5 kg (140 lb)  BMI 23.30 kg/m?  ? ?Constitutional : No distress, cooperative, alert  ?Lymphatics/Throat: Supple with no lymphadenopathy  ?Respiratory: Clear to auscultation bilaterally  ?Cardiovascular: Regular rate and rhythm  ?Gastrointestinal: Soft, non-tender, non-distended, no organomegaly.  ?Musculoskeletal: Steady gait and movement  ?Skin: Cool and moist, SK noted on scalp, right temporal/parietal area. Two additional smaller ones noted on left temporal hair  line  ?Psychiatric: Normal affect, non-agitated, not confused  ? ? ? ?LABS:  ?n/a  ? ?RADS: ?n/a ? ?Assessment:  ? ? 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 ?Plan:  ? ? ?1. Seborrheic keratoses [L82.1] Discussed surgical curette/removal. Alternatives include continued observation. Benefits include possible symptom relief, pathologic evaluation, improved cosmesis. ?Discussed the risk of surgery including recurrence, chronic pain, post-op infxn, poor cosmesis, poor/delayed wound healing, and possible re-operation to address said risks. The risks of general anesthetic, if used, includes MI, CVA, sudden death or even reaction to anesthetic medications also discussed.  ?Typical post-op recovery time of 3-5 days with possible activity restrictions were also discussed. ? ?The patient verbalized understanding and all questions were answered to the patient's satisfaction. ? ?2. Patient has elected to proceed with surgical treatment. Procedure will be scheduled. Pt will discuss with dermatologist prior to procedure if smaller lesions on left temple can be removed by them. Will still plan to proceed with removing all three just in case ? ?labs/images/medications/previous chart entries reviewed personally and relevant changes/updates noted above. ?

## 2021-11-21 NOTE — Op Note (Addendum)
Pre-Op Dx: Seborrhoic keratosis x3 on scalp ?Post-Op Dx: Same ?Anesthesia: LMA ?EBL: ?Complications:  none apparent ?Specimen: Seborrhic keratosis   ?Procedure: Shave biopsy of some seborrhic keratosis of scalp x3  ? ?Surgeon: Tonna Boehringer ? ?Indications for procedure: ?See H&P ? ?Description of Procedure:  ?Consent obtained, time out performed.  Patient placed in supine position.  Area sterilized and draped in usual position.  Local infused to area previously around the previously marked lesions.  15 blade then used to shave off the visible lesions down to healthy dermis.  Biggest lesion measuring 4 cm x 3.3 cm.  Smaller than lesions measuring 3 mm x 5 mm each, for total of 3 lesions removed. ? ?All removed specimens passed off operative field pending pathology.  Hemostasis achieved with combination of electrocautery and manual pressure and application of Surgicel to the larger lesion. Wound then dressed with 4 x 4's and Kerlix rolls to secured in place.  Pt tolerated procedure well, and transferred to PACU in stable condition. Sponge and instrument count correct at end of procedure. ? ?

## 2021-11-24 LAB — SURGICAL PATHOLOGY

## 2022-11-27 IMAGING — MG MM DIGITAL SCREENING BILAT W/ TOMO AND CAD
8 series · 8 of 24 positions shown · non-contrast
Comparison: Previous exam(s).

CLINICAL DATA: Screening.

EXAM:
DIGITAL SCREENING BILATERAL MAMMOGRAM WITH TOMOSYNTHESIS AND CAD
TECHNIQUE: Bilateral screening digital craniocaudal and mediolateral oblique
mammograms were obtained. Bilateral screening digital breast
tomosynthesis was performed. The images were evaluated with
computer-aided detection.

[L CC synth-2D]
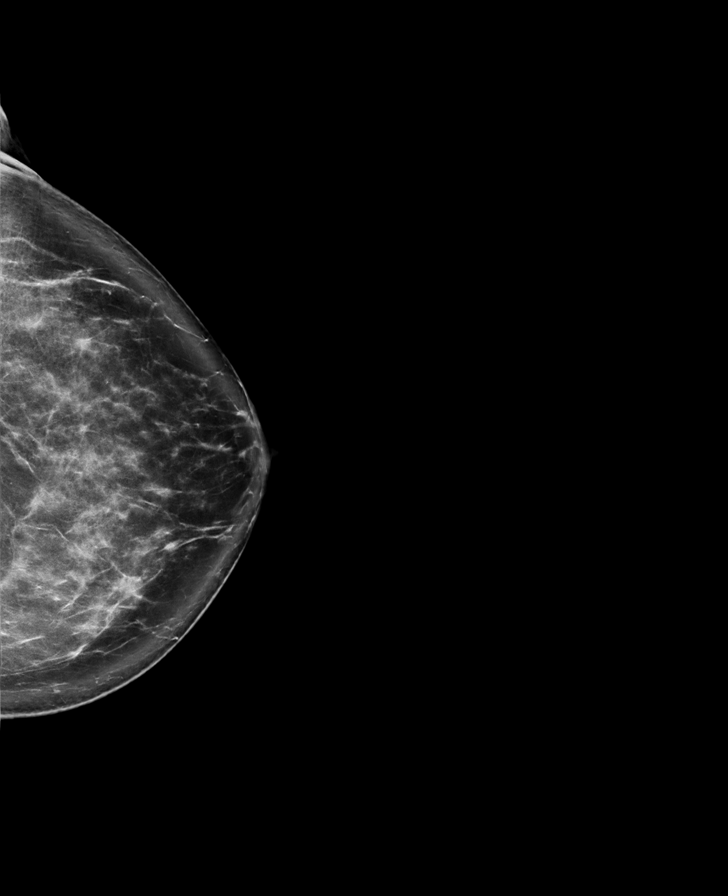

[L MLO synth-2D]
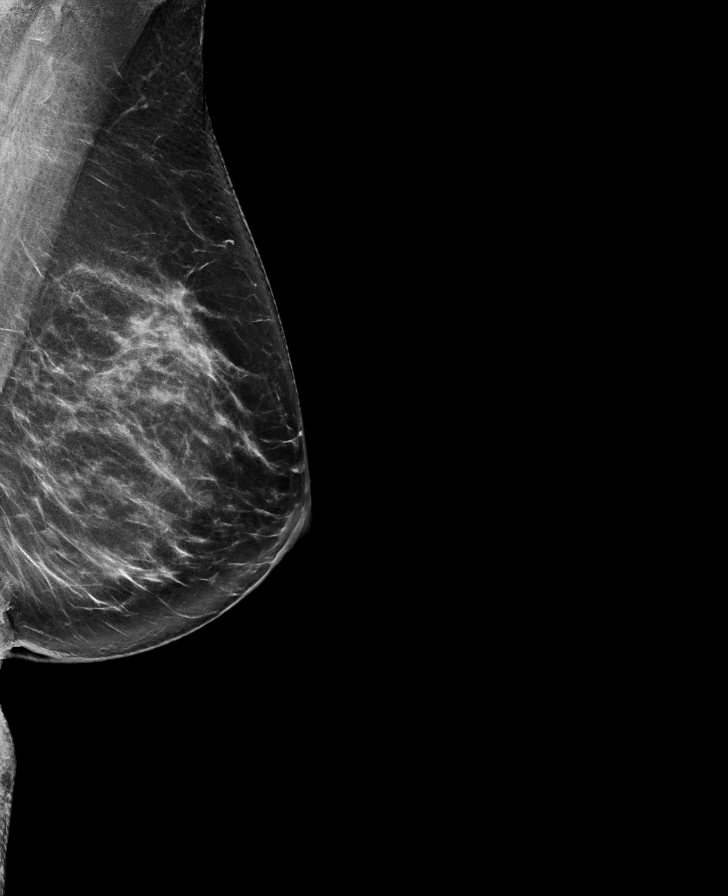

[R MLO synth-2D]
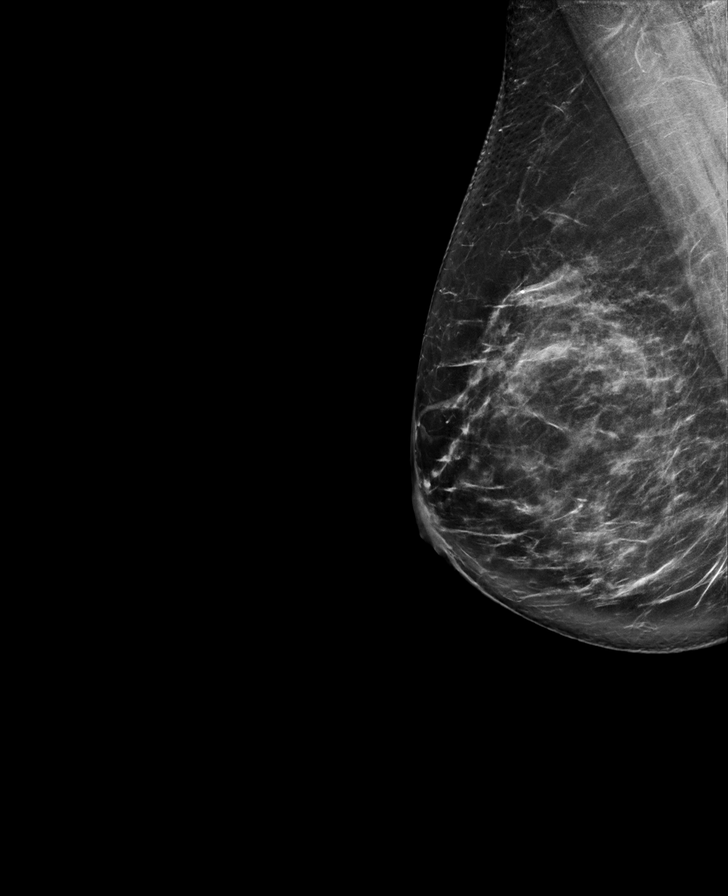

[R CC synth-2D]
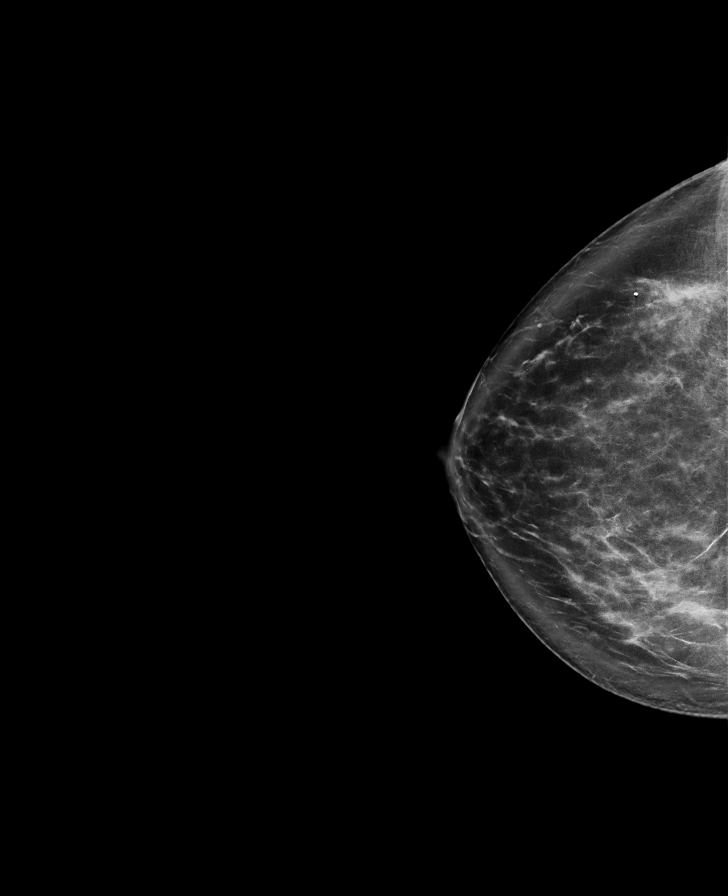

[L CC tomo · tomo slice 48/95.0]
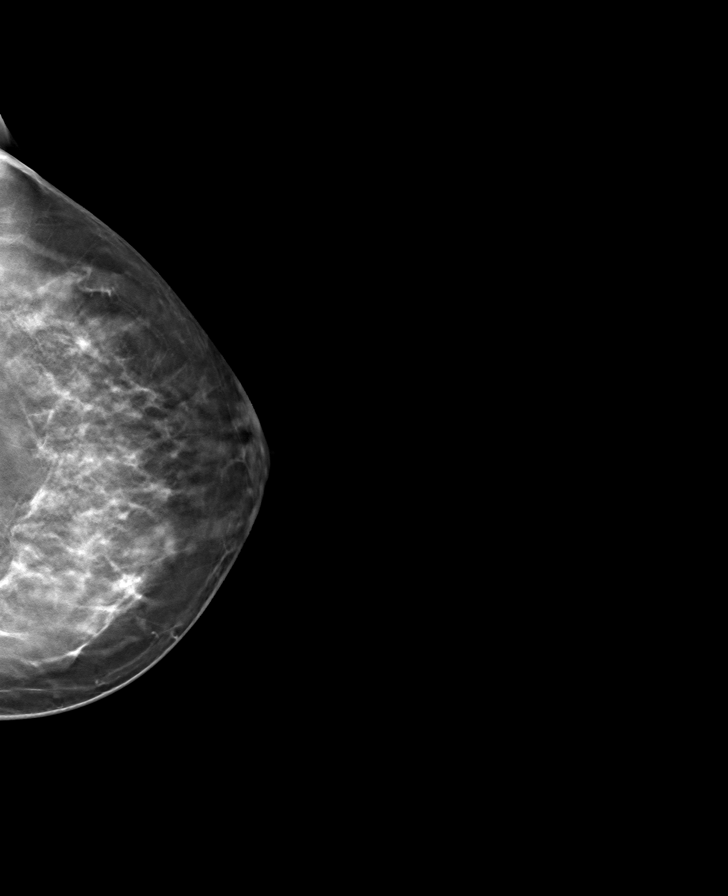

[L MLO tomo · tomo slice 45/89.0]
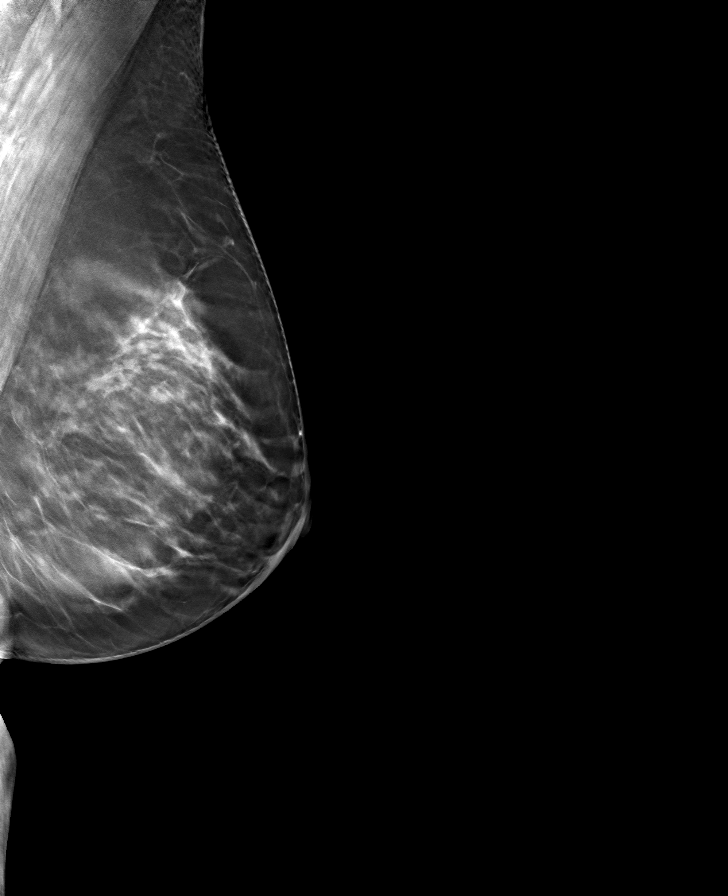

[R CC tomo · tomo slice 49/97.0]
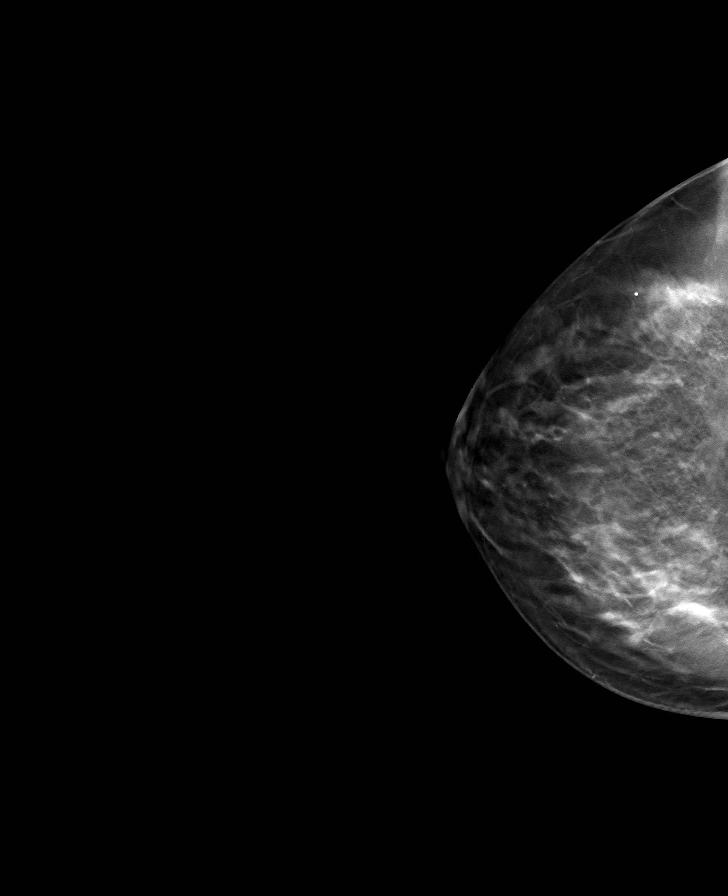

[R MLO tomo · tomo slice 45/88.0]
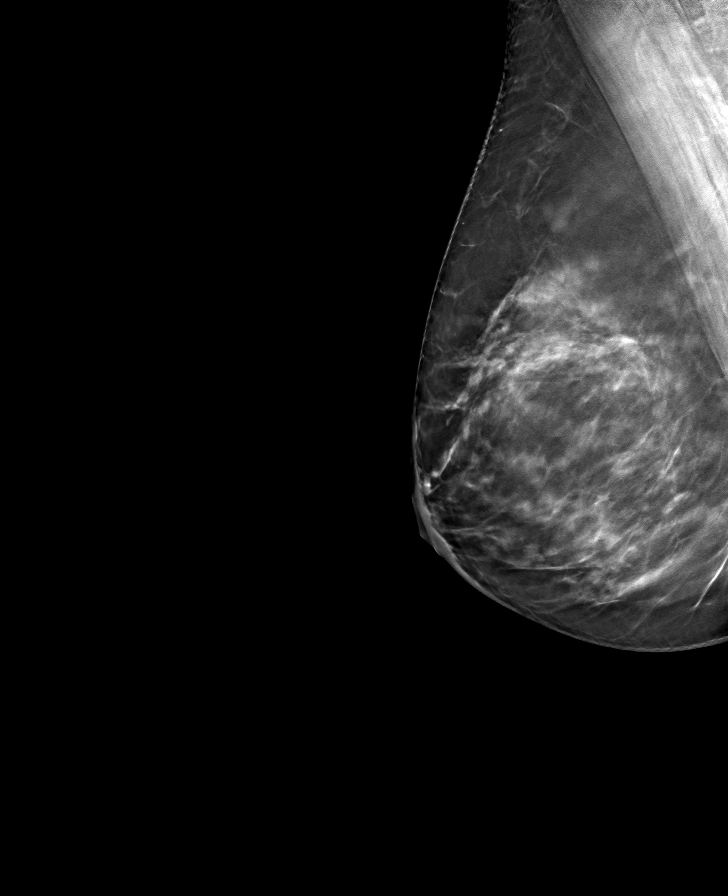

[8 of 24 positions shown; findings below may reference images not displayed]

ACR Breast Density Category c: The breast tissue is heterogeneously
dense, which may obscure small masses.
FINDINGS: There are no findings suspicious for malignancy. The images were
evaluated with computer-aided detection.
IMPRESSION: No mammographic evidence of malignancy. A result letter of this
screening mammogram will be mailed directly to the patient.

RECOMMENDATION:
Screening mammogram in one year. (Code:T4-5-GWO)

BI-RADS CATEGORY  1: Negative.

## 2023-12-30 ENCOUNTER — Other Ambulatory Visit: Payer: Self-pay | Admitting: Internal Medicine

## 2023-12-30 DIAGNOSIS — Z1231 Encounter for screening mammogram for malignant neoplasm of breast: Secondary | ICD-10-CM

## 2024-04-25 ENCOUNTER — Ambulatory Visit
Admission: RE | Admit: 2024-04-25 | Discharge: 2024-04-25 | Disposition: A | Discharge status: Home / Self Care | Source: Ambulatory Visit | Attending: Internal Medicine | Admitting: Internal Medicine

## 2024-04-25 ENCOUNTER — Other Ambulatory Visit: Payer: Self-pay | Admitting: Internal Medicine

## 2024-04-25 DIAGNOSIS — R42 Dizziness and giddiness: Secondary | ICD-10-CM

## 2024-06-23 ENCOUNTER — Other Ambulatory Visit: Payer: Self-pay | Admitting: Obstetrics and Gynecology

## 2024-06-23 DIAGNOSIS — Z1231 Encounter for screening mammogram for malignant neoplasm of breast: Secondary | ICD-10-CM

## 2024-07-31 ENCOUNTER — Encounter

## 2024-08-22 ENCOUNTER — Inpatient Hospital Stay: Admission: RE | Admit: 2024-08-22 | Source: Ambulatory Visit
